# Patient Record
Sex: Male | Born: 1970 | Race: Black or African American | Hispanic: No | Marital: Single | Smoking: Never smoker
Health system: Southern US, Community
[De-identification: ages and names within clinical notes are randomized; demographics above are authoritative.]

---

## 2011-11-13 ENCOUNTER — Emergency Department (HOSPITAL_COMMUNITY): Payer: Self-pay

## 2011-11-13 ENCOUNTER — Encounter: Payer: Self-pay | Admitting: *Deleted

## 2011-11-13 ENCOUNTER — Emergency Department (HOSPITAL_COMMUNITY)
Admission: EM | Admit: 2011-11-13 | Discharge: 2011-11-13 | Disposition: A | Discharge status: Home / Self Care | Payer: Self-pay | Attending: Emergency Medicine | Admitting: Emergency Medicine

## 2011-11-13 DIAGNOSIS — R0789 Other chest pain: Secondary | ICD-10-CM

## 2011-11-13 DIAGNOSIS — R071 Chest pain on breathing: Secondary | ICD-10-CM | POA: Insufficient documentation

## 2011-11-13 DIAGNOSIS — S0990XA Unspecified injury of head, initial encounter: Secondary | ICD-10-CM | POA: Insufficient documentation

## 2011-11-13 DIAGNOSIS — R112 Nausea with vomiting, unspecified: Secondary | ICD-10-CM | POA: Insufficient documentation

## 2011-11-13 DIAGNOSIS — R21 Rash and other nonspecific skin eruption: Secondary | ICD-10-CM | POA: Insufficient documentation

## 2011-11-13 DIAGNOSIS — M549 Dorsalgia, unspecified: Secondary | ICD-10-CM | POA: Insufficient documentation

## 2011-11-13 DIAGNOSIS — R51 Headache: Secondary | ICD-10-CM | POA: Insufficient documentation

## 2011-11-13 MED ORDER — NAPROXEN 500 MG PO TABS: 500.0000 mg | ORAL_TABLET | Freq: Two times a day (BID) | ORAL | Status: AC

## 2011-11-13 MED ORDER — HYDROCODONE-ACETAMINOPHEN 5-325 MG PO TABS: 1.0000 | ORAL_TABLET | Freq: Four times a day (QID) | ORAL | Status: AC | PRN
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
## 2013-09-09 ENCOUNTER — Ambulatory Visit: Payer: Self-pay | Admitting: Family Medicine

## 2015-08-07 ENCOUNTER — Emergency Department (HOSPITAL_COMMUNITY)
Admission: EM | Admit: 2015-08-07 | Discharge: 2015-08-08 | Disposition: A | Discharge status: Home / Self Care | Payer: 59 | Attending: Emergency Medicine | Admitting: Emergency Medicine

## 2015-08-07 ENCOUNTER — Encounter (HOSPITAL_COMMUNITY): Payer: Self-pay | Admitting: Emergency Medicine

## 2015-08-07 DIAGNOSIS — R319 Hematuria, unspecified: Secondary | ICD-10-CM | POA: Insufficient documentation

## 2015-08-07 LAB — URINALYSIS, ROUTINE W REFLEX MICROSCOPIC
BILIRUBIN URINE: NEGATIVE
Glucose, UA: NEGATIVE mg/dL
Hgb urine dipstick: NEGATIVE
Leukocytes, UA: NEGATIVE
NITRITE: NEGATIVE
PH: 6 (ref 5.0–8.0)
Protein, ur: NEGATIVE mg/dL
Specific Gravity, Urine: >1.03 — ABNORMAL HIGH (ref 1.005–1.030)
UROBILINOGEN UA: 0.2 mg/dL (ref 0.0–1.0)
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
## 2015-08-07 NOTE — ED Notes (Signed)
Patient states one episode of hematuria tonight. Denies pain or other symptoms.

## 2015-08-08 NOTE — Discharge Instructions (Signed)
Your urine showed no sign of infection or blood. I recommended she drink plain water. Please follow-up with your primary care physician or urology if your symptoms return. Please return to the emergency department if you begin having back pain, abdominal pain, return of blood in your urine, fever, vomiting that will not stop.   Hematuria Hematuria is blood in your urine. It can be caused by a bladder infection, kidney infection, prostate infection, kidney stone, or cancer of your urinary tract. Infections can usually be treated with medicine, and a kidney stone usually will pass through your urine. If neither of these is the cause of your hematuria, further workup to find out the reason may be needed. It is very important that you tell your health care provider about any blood you see in your urine, even if the blood stops without treatment or happens without causing pain. Blood in your urine that happens and then stops and then happens again can be a symptom of a very serious condition. Also, pain is not a symptom in the initial stages of many urinary cancers. HOME CARE INSTRUCTIONS   Drink lots of fluid, 3-4 quarts a day. If you have been diagnosed with an infection, cranberry juice is especially recommended, in addition to large amounts of water.  Avoid caffeine, tea, and carbonated beverages because they tend to irritate the bladder.  Avoid alcohol because it may irritate the prostate.  Take all medicines as directed by your health care provider.  If you were prescribed an antibiotic medicine, finish it all even if you start to feel better.  If you have been diagnosed with a kidney stone, follow your health care provider's instructions regarding straining your urine to catch the stone.  Empty your bladder often. Avoid holding urine for long periods of time.  After a bowel movement, women should cleanse front to back. Use each tissue only once.  Empty your bladder before and after sexual  intercourse if you are a male. SEEK MEDICAL CARE IF:  You develop back pain.  You have a fever.  You have a feeling of sickness in your stomach (nausea) or vomiting.  Your symptoms are not better in 3 days. Return sooner if you are getting worse. SEEK IMMEDIATE MEDICAL CARE IF:   You develop severe vomiting and are unable to keep the medicine down.  You develop severe back or abdominal pain despite taking your medicines.  You begin passing a large amount of blood or clots in your urine.  You feel extremely weak or faint, or you pass out. MAKE SURE YOU:   Understand these instructions.  Will watch your condition.  Will get help right away if you are not doing well or get worse. Document Released: 11/06/2005 Document Revised: 03/23/2014 Document Reviewed: 07/07/2013 [facility] Patient Information 2015 [place], [place]. This information is not intended to replace advice given to you by your health care provider. Make sure you discuss any questions you have with your health care provider.

## 2015-08-08 NOTE — ED Notes (Signed)
Discharge instructions given, pt demonstrated teach back and verbal understanding. No concerns voiced.  

## 2015-08-09 LAB — URINE CULTURE: CULTURE: NO GROWTH

## 2015-10-25 ENCOUNTER — Emergency Department (HOSPITAL_BASED_OUTPATIENT_CLINIC_OR_DEPARTMENT_OTHER): Admission: EM | Admit: 2015-10-25 | Discharge: 2015-10-25 | Discharge status: Absent without leave | Payer: 59

## 2015-10-25 ENCOUNTER — Encounter (HOSPITAL_BASED_OUTPATIENT_CLINIC_OR_DEPARTMENT_OTHER): Payer: Self-pay | Admitting: Emergency Medicine

## 2015-10-25 ENCOUNTER — Ambulatory Visit (INDEPENDENT_AMBULATORY_CARE_PROVIDER_SITE_OTHER): Payer: 59 | Admitting: Family Medicine

## 2015-10-25 VITALS — BP 120/80 | HR 55 | Temp 98.2°F | Resp 17 | Ht 76.0 in | Wt 199.0 lb

## 2015-10-25 DIAGNOSIS — R29818 Other symptoms and signs involving the nervous system: Secondary | ICD-10-CM | POA: Diagnosis not present

## 2015-10-25 DIAGNOSIS — S060X1A Concussion with loss of consciousness of 30 minutes or less, initial encounter: Secondary | ICD-10-CM | POA: Diagnosis not present

## 2015-10-25 MED ORDER — HYDROCODONE-ACETAMINOPHEN 5-325 MG PO TABS: 1.0000 | ORAL_TABLET | Freq: Four times a day (QID) | ORAL | Status: AC | PRN

## 2015-10-25 MED ORDER — CYCLOBENZAPRINE HCL 10 MG PO TABS: 10.0000 mg | ORAL_TABLET | Freq: Three times a day (TID) | ORAL | Status: DC | PRN
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
## 2015-10-25 NOTE — Patient Instructions (Addendum)
Go to [facility] [place] for CT Head. You will need to register at the Emergency Department as OUTPATIENT CT. DO NOT REGISTER AS ED PATIENT.   [facility] [place]  [address], [place], [place] [address]  Phone: [phone]  YOU CAN NOT DRIVE UNTIL CLEARED BY A DOCTOR.  YOU NEED TO BE AT HOME AND REST - SLEEP.  YOU CAN TAKE TYLENOL OR THE MEDICATION I GAVE YOU. I DO NOT WANT YOU TO GO BACK TO THE CHIROPRACTOR FOR NOW. NO ALCOHOL. NO SCREENS - NO TV, PHONE, COMPUTER.  YOU CAN LISTEN TO MUSIC OR READ SOMETHING EASY.  IF YOU ARE GETTING WORSE AT ALL, COME BACK IMMEDIATELY.  Concussion, Adult A concussion is a brain injury. It is caused by:  A hit to the head.  A quick and sudden movement (jolt) of the head or neck. A concussion is usually not life threatening. Even so, it can cause serious problems. If you had a concussion before, you may have concussion-like problems after a hit to your head. HOME CARE General Instructions  Follow your doctor's directions carefully.  Take medicines only as told by your doctor.  Only take medicines your doctor says are safe.  Do not drink alcohol until your doctor says it is okay. Alcohol and some drugs can slow down healing. They can also put you at risk for further injury.  If you are having trouble remembering things, write them down.  Try to do one thing at a time if you get distracted easily. For example, do not watch TV while making dinner.  Talk to your family members or close friends when making important decisions.  Follow up with your doctor as told.  Watch your symptoms. Tell others to do the same. Serious problems can sometimes happen after a concussion. Older adults are more likely to have these problems.  Tell your teachers, school nurse, school counselor, coach, Event organiserathletic trainer, or work Production designer, theatre/television/filmmanager about your concussion. Tell them about what you can or cannot do. They should watch to see if:  It gets even harder for you to  pay attention or concentrate.  It gets even harder for you to remember things or learn new things.  You need more time than normal to finish things.  You become annoyed (irritable) more than before.  You are not able to deal with stress as well.  You have more problems than before.  Rest. Make sure you:  Get plenty of sleep at night.  Go to sleep early.  Go to bed at the same time every day. Try to wake up at the same time.  Rest during the day.  Take naps when you feel tired.  Limit activities where you have to think a lot or concentrate. These include:  Doing homework.  Doing work related to a job.  Watching TV.  Using the computer. Returning To Your Regular Activities Return to your normal activities slowly, not all at once. You must give your body and brain enough time to heal.   Do not play sports or do other athletic activities until your doctor says it is okay.  Ask your doctor when you can drive, ride a bicycle, or work other vehicles or machines. Never do these things if you feel dizzy.  Ask your doctor about when you can return to work or school. Preventing Another Concussion It is very important to avoid another brain injury, especially before you have healed. In rare cases, another injury  can lead to permanent brain damage, brain swelling, or death. The risk of this is greatest during the first 7-10 days after your injury. Avoid injuries by:   Wearing a seat belt when riding in a car.  Not drinking too much alcohol.  Avoiding activities that could lead to a second concussion (such as contact sports).  Wearing a helmet when doing activities like:  Biking.  Skiing.  Skateboarding.  Skating.  Making your home safer by:  Removing things from the floor or stairways that could make you trip.  Using grab bars in bathrooms and handrails by stairs.  Placing non-slip mats on floors and in bathtubs.  Improve lighting in dark areas. GET HELP  IF:  It gets even harder for you to pay attention or concentrate.  It gets even harder for you to remember things or learn new things.  You need more time than normal to finish things.  You become annoyed (irritable) more than before.  You are not able to deal with stress as well.  You have more problems than before.  You have problems keeping your balance.  You are not able to react quickly when you should. Get help if you have any of these problems for more than 2 weeks:   Lasting (chronic) headaches.  Dizziness or trouble balancing.  Feeling sick to your stomach (nausea).  Seeing (vision) problems.  Being affected by noises or light more than normal.  Feeling sad, low, down in the dumps, blue, gloomy, or empty (depressed).  Mood changes (mood swings).  Feeling of fear or nervousness about what may happen (anxiety).  Feeling annoyed.  Memory problems.  Problems concentrating or paying attention.  Sleep problems.  Feeling tired all the time. GET HELP RIGHT AWAY IF:   You have bad headaches or your headaches get worse.  You have weakness (even if it is in one hand, leg, or part of the face).  You have loss of feeling (numbness).  You feel off balance.  You keep throwing up (vomiting).  You feel tired.  One black center of your eye (pupil) is larger than the other.  You twitch or shake violently (convulse).  Your speech is not clear (slurred).  You are more confused, easily angered (agitated), or annoyed than before.  You have more trouble resting than before.  You are unable to recognize people or places.  You have neck pain.  It is difficult to wake you up.  You have unusual behavior changes.  You pass out (lose consciousness). MAKE SURE YOU:   Understand these instructions.  Will watch your condition.  Will get help right away if you are not doing well or get worse.   This information is not intended to replace advice given to you  by your health care provider. Make sure you discuss any questions you have with your health care provider.   Document Released: 10/25/2009 Document Revised: 11/27/2014 Document Reviewed: 05/29/2013 Elsevier Interactive Patient Education 2016 Elsevier Inc.   Head Injury, Adult You have a head injury. Headaches and throwing up (vomiting) are common after a head injury. It should be easy to wake up from sleeping. Sometimes you must stay in the hospital. Most problems happen within the first 24 hours. Side effects may occur up to 7-10 days after the injury.  WHAT ARE THE TYPES OF HEAD INJURIES? Head injuries can be as minor as a bump. Some head injuries can be more severe. More severe head injuries include:  A jarring injury  to the brain (concussion).  A bruise of the brain (contusion). This mean there is bleeding in the brain that can cause swelling.  A cracked skull (skull fracture).  Bleeding in the brain that collects, clots, and forms a bump (hematoma). WHEN SHOULD I GET HELP RIGHT AWAY?   You are confused or sleepy.  You cannot be woken up.  You feel sick to your stomach (nauseous) or keep throwing up (vomiting).  Your dizziness or unsteadiness is getting worse.  You have very bad, lasting headaches that are not helped by medicine. Take medicines only as told by your doctor.  You cannot use your arms or legs like normal.  You cannot walk.  You notice changes in the black spots in the center of the colored part of your eye (pupil).  You have clear or bloody fluid coming from your nose or ears.  You have trouble seeing. During the next 24 hours after the injury, you must stay with someone who can watch you. This person should get help right away (call 911 in the [place] if you start to shake and are not able to control it (have seizures), you pass out, or you are unable to wake up. HOW CAN I PREVENT A HEAD INJURY IN THE FUTURE?  Wear seat belts.  Wear a helmet while bike  riding and playing sports like football.  Stay away from dangerous activities around the house. WHEN CAN I RETURN TO NORMAL ACTIVITIES AND ATHLETICS? See your doctor before doing these activities. You should not do normal activities or play contact sports until 1 week after the following symptoms have stopped:  Headache that does not go away.  Dizziness.  Poor attention.  Confusion.  Memory problems.  Sickness to your stomach or throwing up.  Tiredness.  Fussiness.  Bothered by bright lights or loud noises.  Anxiousness or depression.  Restless sleep. MAKE SURE YOU:   Understand these instructions.  Will watch your condition.  Will get help right away if you are not doing well or get worse.   This information is not intended to replace advice given to you by your health care provider. Make sure you discuss any questions you have with your health care provider.   Document Released: 10/19/2008 Document Revised: 11/27/2014 Document Reviewed: 07/14/2013 Elsevier Interactive Patient Education [employer]

## 2015-10-26 ENCOUNTER — Ambulatory Visit (HOSPITAL_BASED_OUTPATIENT_CLINIC_OR_DEPARTMENT_OTHER)
Admission: RE | Admit: 2015-10-26 | Discharge: 2015-10-26 | Disposition: A | Discharge status: Home / Self Care | Payer: 59 | Source: Ambulatory Visit | Attending: Family Medicine | Admitting: Family Medicine

## 2015-10-26 DIAGNOSIS — S060X1A Concussion with loss of consciousness of 30 minutes or less, initial encounter: Secondary | ICD-10-CM | POA: Diagnosis not present

## 2015-10-26 DIAGNOSIS — S098XXA Other specified injuries of head, initial encounter: Secondary | ICD-10-CM | POA: Diagnosis present

## 2015-11-03 ENCOUNTER — Other Ambulatory Visit: Payer: Self-pay | Admitting: Family Medicine

## 2015-11-03 ENCOUNTER — Telehealth: Payer: Self-pay

## 2015-11-03 NOTE — Telephone Encounter (Signed)
Patient brought in forms to completed by Dr. Clelia CroftShaw for FMLA, I have filled out what I can from the OV notes, please fill out the highlighted areas and [facility] at [facility] checkout desk within 5-7 business days. Thank you!

## 2015-11-04 NOTE — Telephone Encounter (Signed)
I saw pt on 12/4 and told him to recheck IN 2 DAYS - it has now been 11 days and he has not been back in so I have no idea what is going on with him - he probably could have gone back over a week ago but he never followed up as instructed.  He needs to recheck in the office asap - can see anyone if he wants to get released for employment. I will be in on Sunday if he wants to wait until then. Ok to fax letter stating: Patient needs to be examined by a physician before it can determine if and when it is safe for him to return to work. I would guess that he may be able to return to work by Monday 11/08/15.

## 2015-11-04 NOTE — Telephone Encounter (Signed)
Ned Card[place]aura Abern[place] [facility] calling to get some sort of documentation about when the patient is able to return to work. 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 Dr. Alver FisherShaw's out of work note states that the patient is to stay out of work until released by a physician. Patient's employer needs an estimated date if possible. If this can be determined please write letter and Luther Parodyaitlin or myself will fax it.   Phone: [phone]  Fax: [phone] (attn Ned CardLaura Abernathy)

## 2015-11-04 NOTE — Telephone Encounter (Signed)
Patient called in on 11/03/15 requesting that a paper he left with us be faxed to his employer before Friday so he would still get paid, that paper was faxed in on 11/03/15 with a successful conformation, not sure if he is needing something other that than.

## 2015-11-05 DIAGNOSIS — Z0271 Encounter for disability determination: Secondary | ICD-10-CM

## 2015-11-09 ENCOUNTER — Ambulatory Visit (INDEPENDENT_AMBULATORY_CARE_PROVIDER_SITE_OTHER): Payer: 59 | Admitting: Internal Medicine

## 2015-11-09 ENCOUNTER — Encounter: Payer: Self-pay | Admitting: Family Medicine

## 2015-11-09 VITALS — BP 122/84 | HR 58 | Temp 98.1°F | Resp 16 | Ht 76.0 in | Wt 197.4 lb

## 2015-11-09 DIAGNOSIS — S060X1D Concussion with loss of consciousness of 30 minutes or less, subsequent encounter: Secondary | ICD-10-CM

## 2015-11-09 DIAGNOSIS — R404 Transient alteration of awareness: Secondary | ICD-10-CM

## 2015-11-09 DIAGNOSIS — G44311 Acute post-traumatic headache, intractable: Secondary | ICD-10-CM

## 2015-11-09 MED ORDER — CYCLOBENZAPRINE HCL 10 MG PO TABS: 10.0000 mg | ORAL_TABLET | Freq: Every day | ORAL | Status: AC

## 2015-11-09 MED ORDER — HYDROXYZINE PAMOATE 25 MG PO CAPS: 25.0000 mg | ORAL_CAPSULE | Freq: Three times a day (TID) | ORAL | Status: AC | PRN

## 2015-11-09 MED ORDER — MELOXICAM 15 MG PO TABS: 15.0000 mg | ORAL_TABLET | Freq: Every day | ORAL | Status: AC

## 2015-11-09 NOTE — Telephone Encounter (Signed)
Done
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
## 2015-11-10 ENCOUNTER — Telehealth: Payer: Self-pay

## 2015-11-10 NOTE — Telephone Encounter (Signed)
Received authorization for patient's MRI with the insurance and received earliest available appointment at [facility].  Spoke to patient about his appointment at [facility] on 11/24/14 at 1:45pm for an MRI Brain W/O Contrast.  He expressed understanding of this appointment.

## 2015-11-12 ENCOUNTER — Ambulatory Visit (HOSPITAL_COMMUNITY)
Admission: RE | Admit: 2015-11-12 | Discharge: 2015-11-12 | Disposition: A | Discharge status: Home / Self Care | Payer: 59 | Source: Ambulatory Visit | Attending: Internal Medicine | Admitting: Internal Medicine

## 2015-11-12 DIAGNOSIS — R404 Transient alteration of awareness: Secondary | ICD-10-CM | POA: Insufficient documentation

## 2015-11-12 DIAGNOSIS — R600 Localized edema: Secondary | ICD-10-CM | POA: Diagnosis not present

## 2015-11-12 DIAGNOSIS — S060X1D Concussion with loss of consciousness of 30 minutes or less, subsequent encounter: Secondary | ICD-10-CM | POA: Diagnosis not present

## 2015-11-12 DIAGNOSIS — G44311 Acute post-traumatic headache, intractable: Secondary | ICD-10-CM

## 2015-11-25 ENCOUNTER — Other Ambulatory Visit (HOSPITAL_COMMUNITY): Payer: 59

## 2015-12-01 ENCOUNTER — Telehealth: Payer: Self-pay

## 2015-12-01 NOTE — Telephone Encounter (Signed)
He was seen on 12/20. Can we write note.

## 2015-12-01 NOTE — Telephone Encounter (Signed)
Pt needs a return to work letter for tomorrow.  Please advise if any additional information is needed.  [phone]

## 2015-12-02 NOTE — Telephone Encounter (Signed)
yes

## 2015-12-02 NOTE — Telephone Encounter (Signed)
Note printed. Left message advising pt.

## 2015-12-10 ENCOUNTER — Telehealth: Payer: Self-pay

## 2015-12-10 NOTE — Telephone Encounter (Signed)
The [employer] faxed over an Attending Physicians Statement that needs to be completed by Dr Clelia Croft, I have filled out what I could from OV notes and highlighted the rest. Please completed and return to the FMLA/Disability box at the 102 checkout desk within 5-7 business days. I will place them in your box on 12/10/15/

## 2015-12-10 NOTE — Telephone Encounter (Signed)
Completed.  Will need 2 OV notes form dec and head CT and MRI results sent with the forms.

## 2015-12-13 NOTE — Telephone Encounter (Signed)
Forms scanned and sent with records to [facility] dept for patient on 12/13/15

## 2017-06-04 ENCOUNTER — Encounter (HOSPITAL_COMMUNITY): Payer: Self-pay

## 2017-06-04 ENCOUNTER — Emergency Department (HOSPITAL_COMMUNITY): Payer: No Typology Code available for payment source

## 2017-06-04 ENCOUNTER — Emergency Department (HOSPITAL_COMMUNITY)
Admission: EM | Admit: 2017-06-04 | Discharge: 2017-06-04 | Disposition: A | Discharge status: Home / Self Care | Payer: No Typology Code available for payment source | Attending: Emergency Medicine | Admitting: Emergency Medicine

## 2017-06-04 DIAGNOSIS — M25531 Pain in right wrist: Secondary | ICD-10-CM

## 2017-06-04 DIAGNOSIS — S46911A Strain of unspecified muscle, fascia and tendon at shoulder and upper arm level, right arm, initial encounter: Secondary | ICD-10-CM

## 2017-06-04 DIAGNOSIS — Y92410 Unspecified street and highway as the place of occurrence of the external cause: Secondary | ICD-10-CM | POA: Insufficient documentation

## 2017-06-04 DIAGNOSIS — M25561 Pain in right knee: Secondary | ICD-10-CM | POA: Diagnosis not present

## 2017-06-04 DIAGNOSIS — Y939 Activity, unspecified: Secondary | ICD-10-CM | POA: Insufficient documentation

## 2017-06-04 DIAGNOSIS — Y999 Unspecified external cause status: Secondary | ICD-10-CM | POA: Insufficient documentation

## 2017-06-04 DIAGNOSIS — S4991XA Unspecified injury of right shoulder and upper arm, initial encounter: Secondary | ICD-10-CM | POA: Diagnosis present

## 2017-06-04 MED ORDER — IBUPROFEN 800 MG PO TABS
800.0000 mg | ORAL_TABLET | Freq: Once | ORAL | Status: AC
  Administered 2017-06-04: 800 mg via ORAL
  Filled 2017-06-04: qty 1

## 2017-06-04 MED ORDER — IBUPROFEN 800 MG PO TABS: 800.0000 mg | ORAL_TABLET | Freq: Three times a day (TID) | ORAL | 0 refills | Status: AC
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
